# Patient Record
Sex: Female | Born: 1995 | Race: White | Hispanic: No | Marital: Single | Smoking: Never smoker
Health system: Southern US, Community
[De-identification: ages and names within clinical notes are randomized; demographics above are authoritative.]

## PROBLEM LIST (undated history)

## (undated) HISTORY — PX: TONSILLECTOMY: SUR1361

---
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
## 2017-09-04 DIAGNOSIS — R0602 Shortness of breath: Secondary | ICD-10-CM | POA: Insufficient documentation

## 2017-09-04 NOTE — ED Triage Notes (Signed)
Pt reports shortness of breath intermittently throughout the last several weeks, states initially was only at night and recently became more frequent. Today began to occur every ten minutes. Denies medical hx.

## 2017-09-05 ENCOUNTER — Emergency Department (HOSPITAL_COMMUNITY): Payer: Managed Care, Other (non HMO)

## 2017-09-05 ENCOUNTER — Emergency Department (HOSPITAL_COMMUNITY)
Admission: EM | Admit: 2017-09-05 | Discharge: 2017-09-05 | Disposition: A | Discharge status: Home / Self Care | Payer: Managed Care, Other (non HMO) | Attending: Emergency Medicine | Admitting: Emergency Medicine

## 2017-09-05 DIAGNOSIS — R0602 Shortness of breath: Secondary | ICD-10-CM

## 2017-09-05 LAB — BASIC METABOLIC PANEL
ANION GAP: 6 (ref 5–15)
BUN: 8 mg/dL (ref 6–20)
CALCIUM: 9 mg/dL (ref 8.9–10.3)
CO2: 23 mmol/L (ref 22–32)
CREATININE: 0.77 mg/dL (ref 0.44–1.00)
Chloride: 108 mmol/L (ref 101–111)
GFR calc non Af Amer: >60 mL/min (ref >60)
Glucose, Bld: 105 mg/dL — ABNORMAL HIGH (ref 65–99)
Potassium: 3.6 mmol/L (ref 3.5–5.1)
SODIUM: 137 mmol/L (ref 135–145)

## 2017-09-05 LAB — CBC
HCT: 38.9 % (ref 36.0–46.0)
HEMOGLOBIN: 13.1 g/dL (ref 12.0–15.0)
MCH: 28.7 pg (ref 26.0–34.0)
MCHC: 33.7 g/dL (ref 30.0–36.0)
MCV: 85.3 fL (ref 78.0–100.0)
PLATELETS: 302 10*3/uL (ref 150–400)
RBC: 4.56 MIL/uL (ref 3.87–5.11)
RDW: 13.4 % (ref 11.5–15.5)
WBC: 10.6 10*3/uL — AB (ref 4.0–10.5)

## 2017-09-05 LAB — TSH: TSH: 4.273 u[IU]/mL (ref 0.350–4.500)

## 2017-09-05 LAB — I-STAT TROPONIN, ED: Troponin i, poc: 0 ng/mL (ref 0.00–0.08)

## 2017-09-05 NOTE — Discharge Instructions (Signed)
Follow-up with a primary care physician for reevaluation of your shortness of breath. We have drawn your thyroid hormone levels here in the emergency department, but they can follow up with the results. Return to the ED immediately if any concerning signs or symptoms develop such as persistent shortness of breath, chest pain, coughing up blood, or passing out.

## 2017-09-05 NOTE — ED Provider Notes (Signed)
[facility]-EMERGENCY DEPT Provider Note   CSN: [id number] Arrival date & time: 09/04/17  2320     History   Chief Complaint Chief Complaint  Patient presents with  . Shortness of Breath  . Chest Pain    HPI Leslie Fisher is a 21 y.o. female with no significant past medical history who presents today with chief complaint acute onset, progressively worsening intermittent shortness of breath. She states that 2-3 weeks ago, she was in [place] for a family trip when she noted shortness of breath at night. She states that the shortness of breath would awaken her, but states "it was not so bad that I wanted to do anything about it". She states that her symptoms began increasing in frequency and would occur throughout the day, and over the past 2 days she noted shortness of breath every 5-10 minutes or so. She states that shortness of breath may last a few seconds up to a few minutes and is associated with a chest pressure but no pain. Shortness of breath is not brought on by activity or rest and occurs randomly. She denies fevers, chills, cough, hemoptysis, palpitations, leg swelling, abdominal pain, n/v/d. No aggravating or alleviating factors noted. She has not tried anything for her symptoms. She denies excessive caffeine intake, alcohol use, recreational drug use, or prior history of DVT or PE.She does not take oral contraceptive pills or other hormone use.  The history is provided by the patient.    No past medical history on file.  There are no active problems to display for this patient.   Past Surgical History:  Procedure Laterality Date  . TONSILLECTOMY      OB History    No data available       Home Medications    Prior to Admission medications   Medication Sig Start Date End Date Taking? Authorizing Provider  Cholecalciferol (VITAMIN D PO) Take 1 capsule by mouth daily.    [provider]  DM-Doxylamine-Acetaminophen (CONTAC COLD+FLU NIGHT PO) Take 2 capsules by  mouth daily as needed. For cold/flu symptoms    [provider]  Multiple Vitamin (MULITIVITAMIN WITH MINERALS) TABS Take 1 tablet by mouth daily.    [provider]  Probiotic Product (PROBIOTIC PO) Take 1 capsule by mouth daily.    [provider]    Family History No family history on file.  Social History Social History  Substance Use Topics  . Smoking status: Never Smoker  . Smokeless tobacco: Not on file  . Alcohol use No     Allergies   Patient has no known allergies.   Review of Systems Review of Systems  Constitutional: Negative for chills and fever.  HENT: Negative for trouble swallowing.   Respiratory: Positive for chest tightness and shortness of breath. Negative for cough.   Cardiovascular: Negative for chest pain and leg swelling.  Gastrointestinal: Negative for abdominal pain, constipation, diarrhea, nausea and vomiting.  All other systems reviewed and are negative.    Physical Exam Updated Vital Signs BP 113/79   Pulse 81   Temp 98.3 F (36.8 C) (Oral)   Resp 14   Ht  (1.575 m)   Wt 90.7 kg (200 lb)   LMP 08/19/2017   SpO2 97%   BMI 36.58 kg/m   Physical Exam  Constitutional: She appears well-developed and well-nourished. No distress.  Resting comfortably in bed  HENT:  Head: Normocephalic and atraumatic.  Eyes: Conjunctivae are normal. Right eye exhibits no discharge. Left  eye exhibits no discharge.  Neck: No JVD present. No tracheal deviation present.  Cardiovascular: Normal rate, regular rhythm, normal heart sounds and intact distal pulses.  Exam reveals no gallop and no friction rub.   No murmur heard. 2+ radial and DP/PT pulses bl, negative Homan's bl, no lower extremity edema   Pulmonary/Chest: Effort normal and breath sounds normal. No respiratory distress. She has no wheezes. She has no rales. She exhibits no tenderness.  Equal rise and fall of chest, no increased work of breathing, speaking in full  sentences without difficulty  Abdominal: Soft. Bowel sounds are normal. She exhibits no distension. There is no tenderness.  Musculoskeletal: She exhibits no edema.  Neurological: She is alert.  Skin: Skin is warm and dry. No erythema.  Psychiatric: She has a normal mood and affect. Her behavior is normal.  Nursing note and vitals reviewed.    ED Treatments / Results  Labs (all labs ordered are listed, but only abnormal results are displayed) Labs Reviewed  BASIC METABOLIC PANEL - Abnormal; Notable for the following:       Result Value   Glucose, Bld 105 (*)    All other components within normal limits  CBC - Abnormal; Notable for the following:    WBC 10.6 (*)    All other components within normal limits  TSH  I-STAT TROPONIN, ED    EKG  EKG Interpretation  Date/Time:  Tuesday September 04 2017 23:46:20 EDT Ventricular Rate:  98 PR Interval:  148 QRS Duration: 86 QT Interval:  368 QTC Calculation: 469 R Axis:   53 Text Interpretation:  Normal sinus rhythm Normal ECG Confirmed by Geoffery LyonseLo, Douglas ([id number]) on 09/05/2017 7:11:51 AM Also confirmed by Geoffery LyonseLo, Douglas ([id number]), editor Barbette Hairassel, Kerry [phone])  on 09/05/2017 8:06:32 AM       Radiology Dg Chest 2 View  Result Date: 09/05/2017 CLINICAL DATA:  Chest tightness today. EXAM: CHEST  2 VIEW COMPARISON:  None. FINDINGS: Heart size is upper normal. Lungs are clear. No pneumothorax or pleural effusion. No bony abnormality. IMPRESSION: No acute disease. Electronically Signed   By: Drusilla Kannerhomas  Dalessio M.D.   On: 09/05/2017 00:27    Procedures Procedures (including critical care time)  Medications Ordered in ED Medications - No data to display   Initial Impression / Assessment and Plan / ED Course  I have reviewed the triage vital signs and the nursing notes.  Pertinent labs & imaging results that were available during my care of the patient were reviewed by me and considered in my medical decision making (see chart for  details).     Patient with paroxysmal shortness of breath which has been increasing in frequency for 3 weeks. Afebrile, vital signs are stable, SPO2 has been greater than 95% on room air while in the ED. She is nontoxic in appearance and in no apparent distress. Breath sounds clear to auscultation bilaterally with no evidence of wheezing. EKG shows normal sinus rhythm without evidence of arrhythmia or ST segment abnormality. She is PERC negative and I doubt PE as the source of her symptoms. Chest x-ray shows no acute cardiopulmonary abnormality with no evidence of pneumonia, TB. No evidence of pericarditis. She has no chest pain. Possible thyroid abnormality contributing to her symptoms, TCH was drawn but she does not need to stay here for the results. She is stable for discharge home with primary care follow-up. Discussed indications for return to the ED. Pt verbalized understanding of and agreement with plan and is safe for  discharge home at this time.  Final Clinical Impressions(s) / ED Diagnoses   Final diagnoses:  Shortness of breath    New Prescriptions Discharge Medication List as of 09/05/2017  7:11 AM       Jeanie Sewer, PA-C 09/05/17 1610    Geoffery Lyons, MD 09/06/17 0236

## 2017-11-13 LAB — OB RESULTS CONSOLE ANTIBODY SCREEN: ANTIBODY SCREEN: NEGATIVE

## 2017-11-13 LAB — OB RESULTS CONSOLE RUBELLA ANTIBODY, IGM: Rubella: IMMUNE

## 2017-11-13 LAB — OB RESULTS CONSOLE GC/CHLAMYDIA
Chlamydia: NEGATIVE
GC PROBE AMP, GENITAL: NEGATIVE

## 2017-11-13 LAB — OB RESULTS CONSOLE RPR: RPR: NONREACTIVE

## 2017-11-13 LAB — OB RESULTS CONSOLE ABO/RH: RH TYPE: POSITIVE

## 2017-11-13 LAB — OB RESULTS CONSOLE HIV ANTIBODY (ROUTINE TESTING): HIV: NONREACTIVE

## 2017-11-13 LAB — OB RESULTS CONSOLE HEPATITIS B SURFACE ANTIGEN: HEP B S AG: NEGATIVE

## 2018-02-11 IMAGING — CR DG CHEST 2V
2 series · 2 of 2 positions shown · non-contrast
Comparison: None.

CLINICAL DATA: Chest tightness today.

EXAM:
CHEST  2 VIEW

[chest pa]
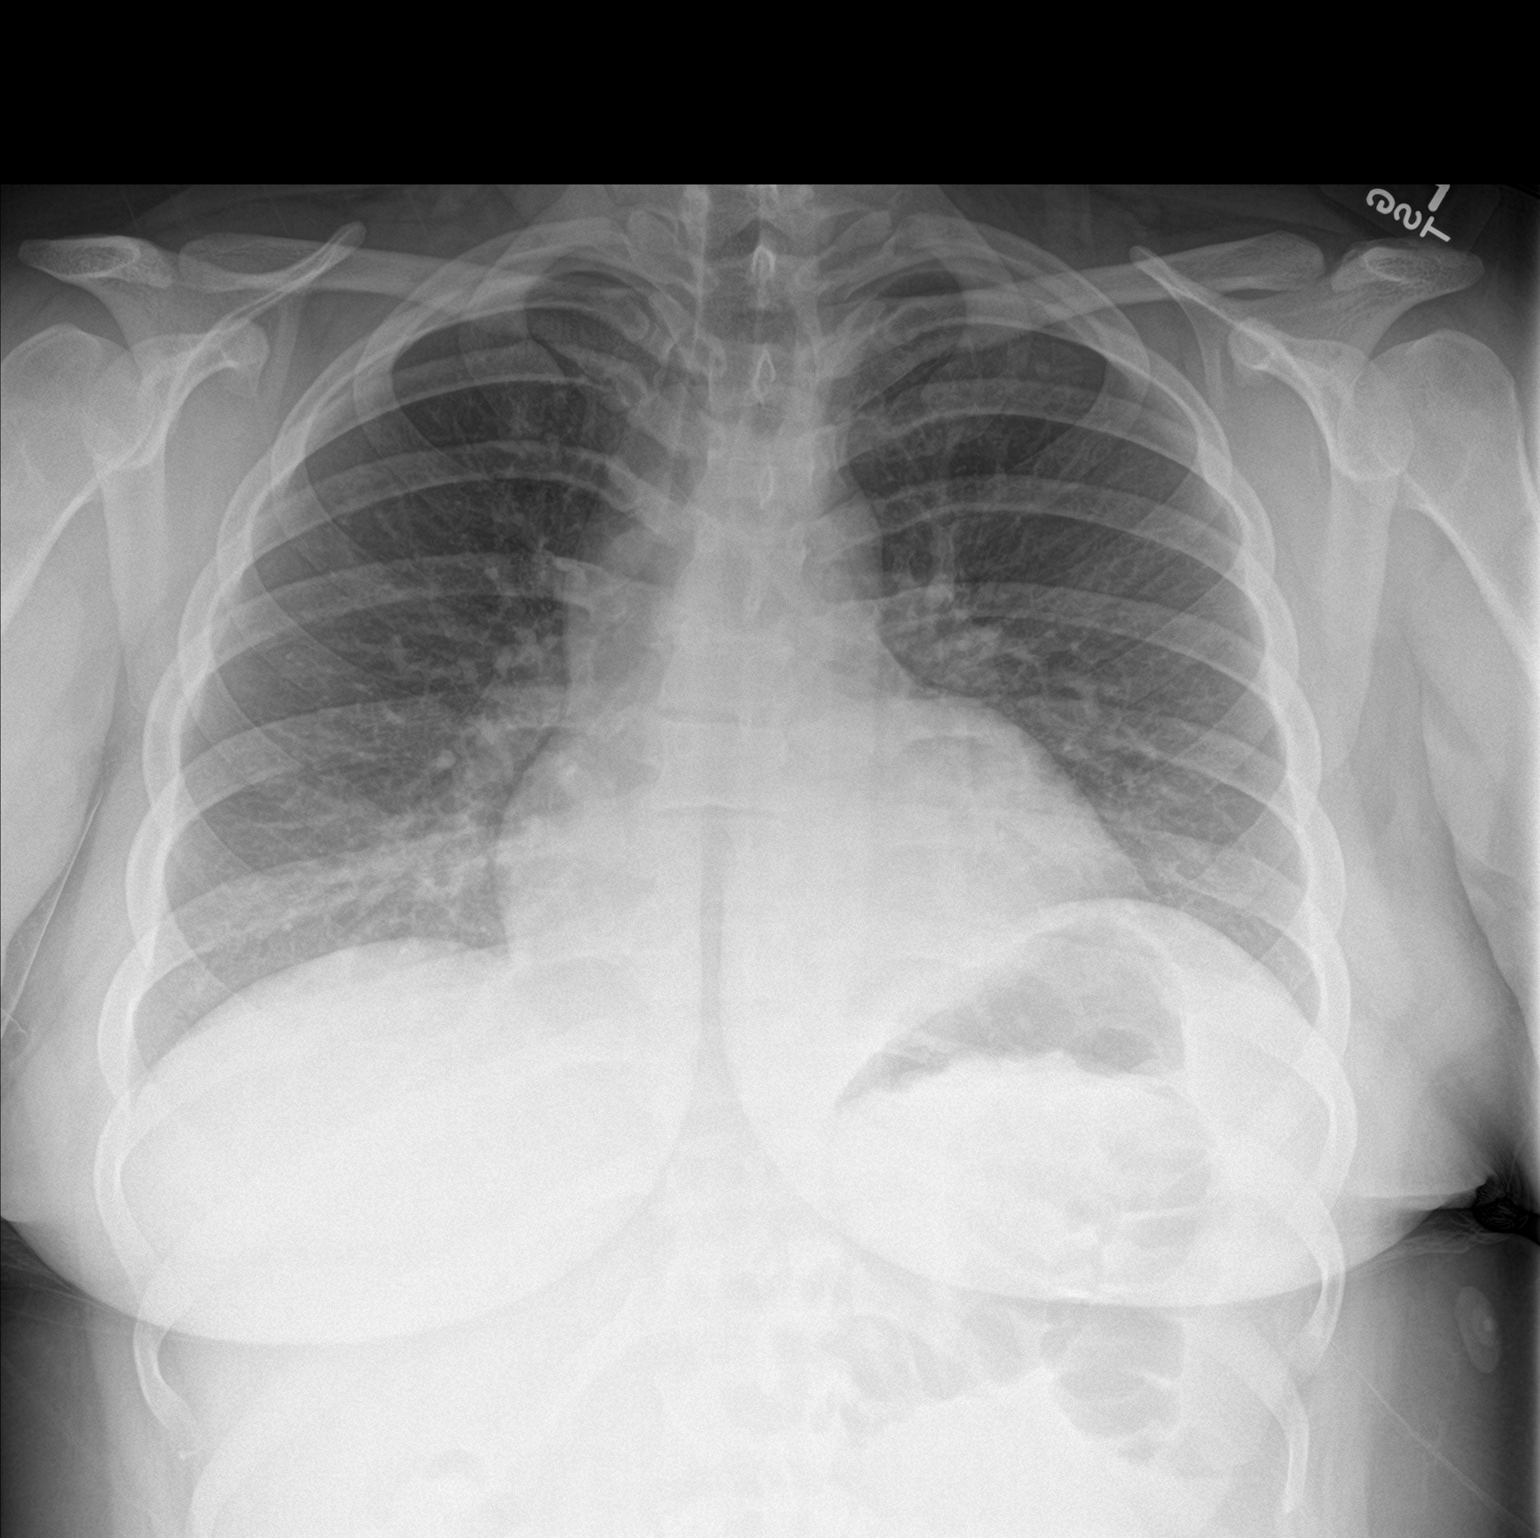

[chest lat]
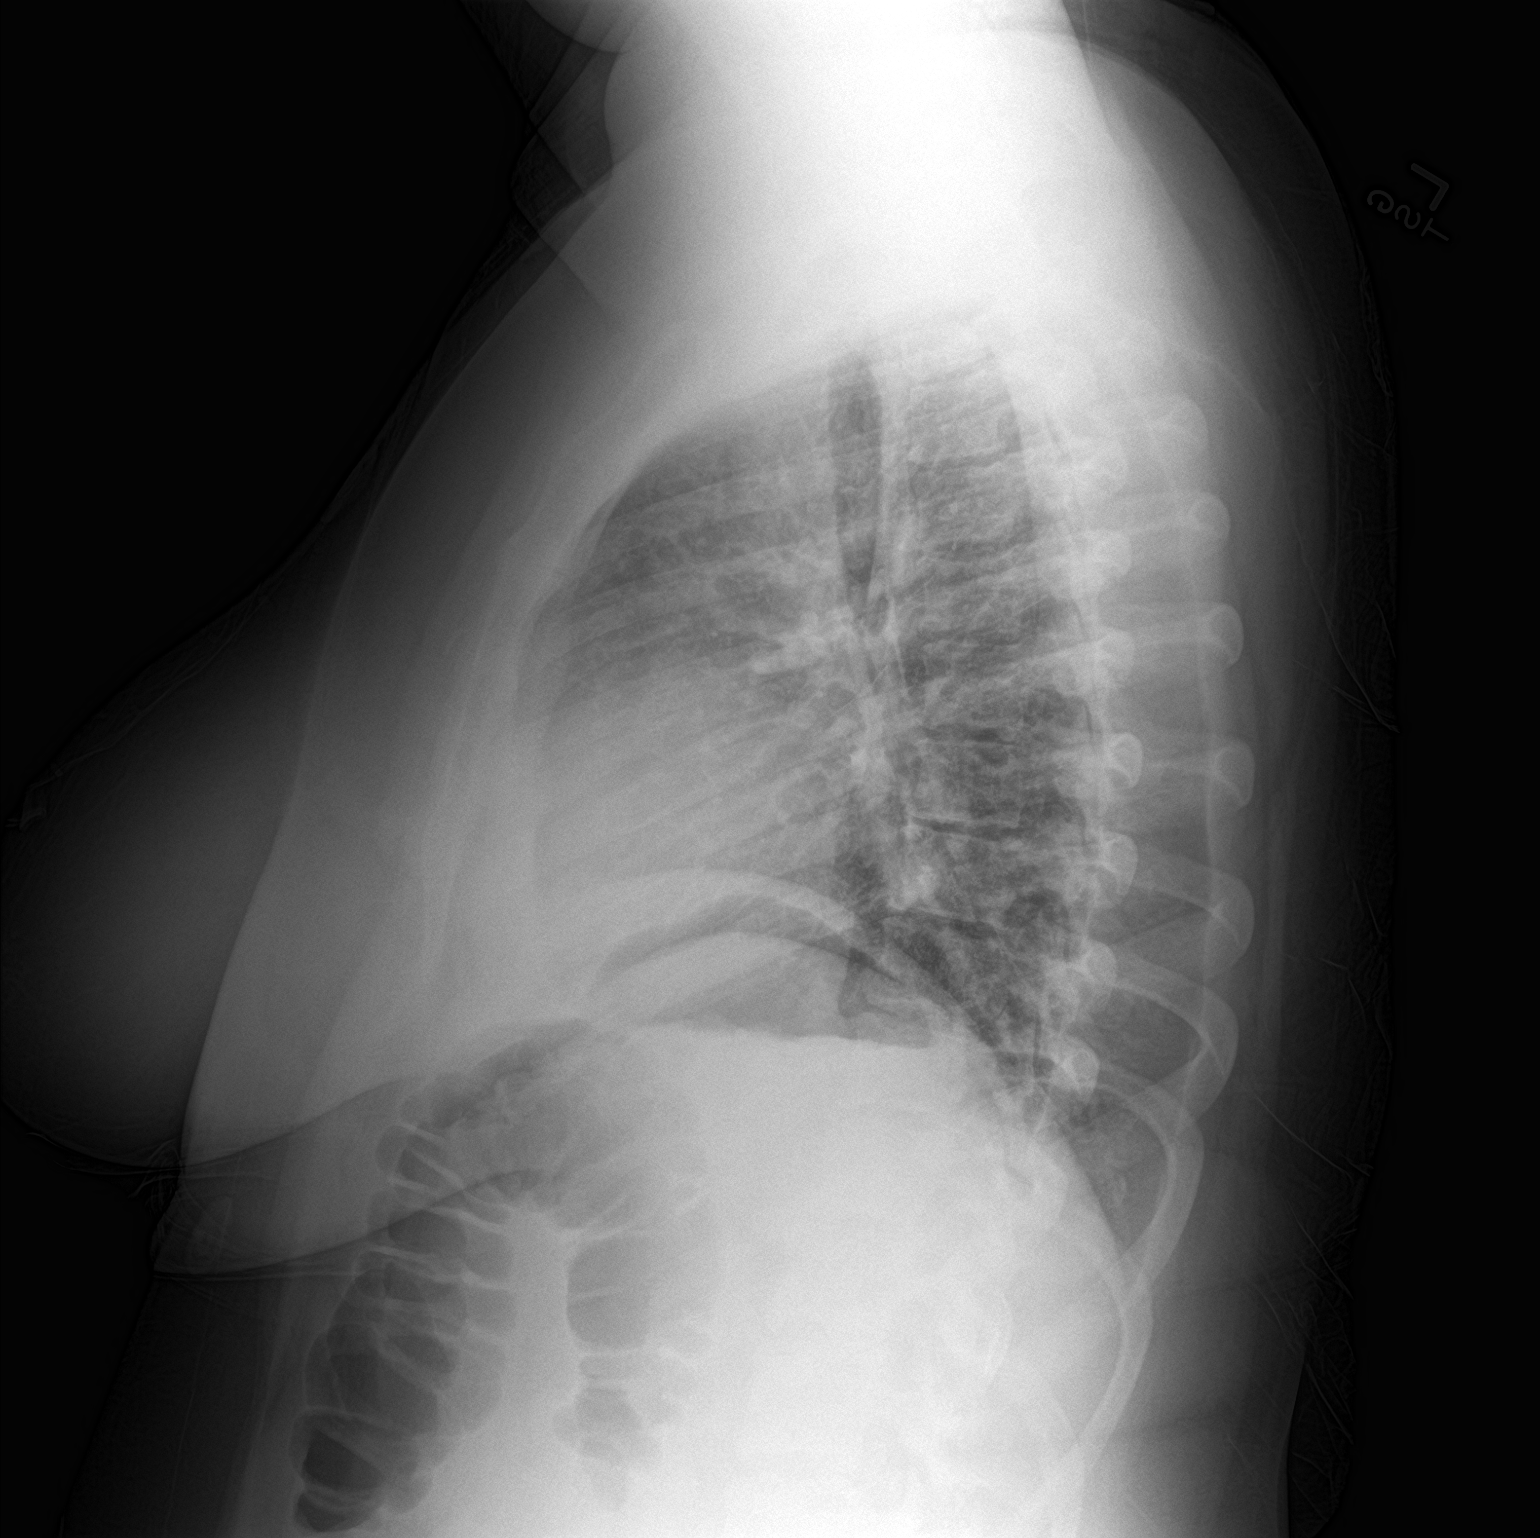

[2 of 2 positions shown; findings below may reference images not displayed]

FINDINGS: Heart size is upper normal. Lungs are clear. No pneumothorax or
pleural effusion. No bony abnormality.
IMPRESSION: No acute disease.

## 2018-05-27 LAB — OB RESULTS CONSOLE GBS
GBS: NEGATIVE
GBS: NEGATIVE
STREP GROUP B AG: NEGATIVE

## 2018-06-13 ENCOUNTER — Telehealth (HOSPITAL_COMMUNITY): Payer: Self-pay | Admitting: *Deleted

## 2018-06-13 ENCOUNTER — Encounter (HOSPITAL_COMMUNITY): Payer: Self-pay | Admitting: *Deleted

## 2018-06-13 NOTE — Telephone Encounter (Signed)
Preadmission screen  

## 2018-06-19 ENCOUNTER — Inpatient Hospital Stay (HOSPITAL_COMMUNITY): Payer: 59 | Admitting: Anesthesiology

## 2018-06-19 ENCOUNTER — Other Ambulatory Visit: Payer: Self-pay

## 2018-06-19 ENCOUNTER — Inpatient Hospital Stay (HOSPITAL_COMMUNITY)
Admission: AD | Admit: 2018-06-19 | Discharge: 2018-06-22 | DRG: 788 | Disposition: A | Discharge status: Home / Self Care | Payer: 59 | Attending: Obstetrics and Gynecology | Admitting: Obstetrics and Gynecology

## 2018-06-19 ENCOUNTER — Encounter (HOSPITAL_COMMUNITY): Payer: Self-pay | Admitting: *Deleted

## 2018-06-19 ENCOUNTER — Encounter (HOSPITAL_COMMUNITY)
Admission: AD | Disposition: A | Discharge status: Home / Self Care | Payer: Self-pay | Source: Home / Self Care | Attending: Obstetrics and Gynecology

## 2018-06-19 DIAGNOSIS — O99214 Obesity complicating childbirth: Secondary | ICD-10-CM | POA: Diagnosis present

## 2018-06-19 DIAGNOSIS — Z3A39 39 weeks gestation of pregnancy: Secondary | ICD-10-CM | POA: Diagnosis not present

## 2018-06-19 DIAGNOSIS — Z3483 Encounter for supervision of other normal pregnancy, third trimester: Secondary | ICD-10-CM | POA: Diagnosis present

## 2018-06-19 LAB — CBC
HEMATOCRIT: 36.3 % (ref 36.0–46.0)
Hemoglobin: 12.1 g/dL (ref 12.0–15.0)
MCH: 28.3 pg (ref 26.0–34.0)
MCHC: 33.3 g/dL (ref 30.0–36.0)
MCV: 85 fL (ref 78.0–100.0)
Platelets: 207 10*3/uL (ref 150–400)
RBC: 4.27 MIL/uL (ref 3.87–5.11)
RDW: 15.5 % (ref 11.5–15.5)
WBC: 11.1 10*3/uL — ABNORMAL HIGH (ref 4.0–10.5)

## 2018-06-19 LAB — RPR: RPR Ser Ql: NONREACTIVE

## 2018-06-19 LAB — TYPE AND SCREEN
ABO/RH(D): O POS
ANTIBODY SCREEN: NEGATIVE

## 2018-06-19 LAB — ABO/RH: ABO/RH(D): O POS

## 2018-06-19 SURGERY — Surgical Case
Anesthesia: Epidural | Site: Abdomen | Wound class: Clean Contaminated

## 2018-06-19 MED ORDER — DIPHENHYDRAMINE HCL 50 MG/ML IJ SOLN: 12.5000 mg | INTRAMUSCULAR | Status: DC | PRN

## 2018-06-19 MED ORDER — LIDOCAINE HCL (PF) 1 % IJ SOLN: 30.0000 mL | INTRAMUSCULAR | Status: DC | PRN

## 2018-06-19 MED ORDER — OXYTOCIN 10 UNIT/ML IJ SOLN
INTRAMUSCULAR | Status: AC
  Filled 2018-06-19: qty 1

## 2018-06-19 MED ORDER — LIDOCAINE HCL (PF) 1 % IJ SOLN
INTRAMUSCULAR | Status: DC | PRN
  Administered 2018-06-19 (×2): 6 mL via EPIDURAL

## 2018-06-19 MED ORDER — DEXAMETHASONE SODIUM PHOSPHATE 4 MG/ML IJ SOLN
INTRAMUSCULAR | Status: DC | PRN
  Administered 2018-06-19: 4 mg via INTRAVENOUS

## 2018-06-19 MED ORDER — LACTATED RINGERS IV SOLN: 500.0000 mL | Freq: Once | INTRAVENOUS | Status: DC

## 2018-06-19 MED ORDER — PHENYLEPHRINE 40 MCG/ML (10ML) SYRINGE FOR IV PUSH (FOR BLOOD PRESSURE SUPPORT)
PREFILLED_SYRINGE | INTRAVENOUS | Status: AC
  Filled 2018-06-19: qty 10

## 2018-06-19 MED ORDER — EPHEDRINE 5 MG/ML INJ: 10.0000 mg | INTRAVENOUS | Status: DC | PRN

## 2018-06-19 MED ORDER — FENTANYL 2.5 MCG/ML BUPIVACAINE 1/10 % EPIDURAL INFUSION (WH - ANES)
14.0000 mL/h | INTRAMUSCULAR | Status: DC | PRN
  Administered 2018-06-19 (×2): 14 mL/h via EPIDURAL
  Filled 2018-06-19 (×2): qty 100

## 2018-06-19 MED ORDER — SCOPOLAMINE 1 MG/3DAYS TD PT72
MEDICATED_PATCH | TRANSDERMAL | Status: AC
  Filled 2018-06-19: qty 1

## 2018-06-19 MED ORDER — OXYCODONE-ACETAMINOPHEN 5-325 MG PO TABS: 1.0000 | ORAL_TABLET | ORAL | Status: DC | PRN

## 2018-06-19 MED ORDER — FLEET ENEMA 7-19 GM/118ML RE ENEM: 1.0000 | ENEMA | RECTAL | Status: DC | PRN

## 2018-06-19 MED ORDER — DEXAMETHASONE SODIUM PHOSPHATE 4 MG/ML IJ SOLN
INTRAMUSCULAR | Status: AC
Start: 2018-06-19 — End: ?
  Filled 2018-06-19: qty 1

## 2018-06-19 MED ORDER — ONDANSETRON HCL 4 MG/2ML IJ SOLN
INTRAMUSCULAR | Status: DC | PRN
  Administered 2018-06-19: 4 mg via INTRAVENOUS

## 2018-06-19 MED ORDER — MEPERIDINE HCL 25 MG/ML IJ SOLN
INTRAMUSCULAR | Status: DC | PRN
  Administered 2018-06-19 (×2): 12.5 mg via INTRAVENOUS

## 2018-06-19 MED ORDER — SOD CITRATE-CITRIC ACID 500-334 MG/5ML PO SOLN
30.0000 mL | ORAL | Status: DC | PRN
  Administered 2018-06-19: 30 mL via ORAL
  Filled 2018-06-19: qty 15

## 2018-06-19 MED ORDER — ONDANSETRON HCL 4 MG/2ML IJ SOLN
4.0000 mg | Freq: Four times a day (QID) | INTRAMUSCULAR | Status: DC | PRN
  Administered 2018-06-19 (×2): 4 mg via INTRAVENOUS
  Filled 2018-06-19 (×2): qty 2

## 2018-06-19 MED ORDER — LACTATED RINGERS IV SOLN: 500.0000 mL | INTRAVENOUS | Status: DC | PRN

## 2018-06-19 MED ORDER — MEPERIDINE HCL 25 MG/ML IJ SOLN
INTRAMUSCULAR | Status: AC
  Filled 2018-06-19: qty 1

## 2018-06-19 MED ORDER — FENTANYL CITRATE (PF) 100 MCG/2ML IJ SOLN
100.0000 ug | INTRAMUSCULAR | Status: DC | PRN
  Administered 2018-06-19 (×3): 100 ug via INTRAVENOUS
  Filled 2018-06-19 (×3): qty 2

## 2018-06-19 MED ORDER — MORPHINE SULFATE (PF) 0.5 MG/ML IJ SOLN
INTRAMUSCULAR | Status: AC
  Filled 2018-06-19: qty 10

## 2018-06-19 MED ORDER — PHENYLEPHRINE 40 MCG/ML (10ML) SYRINGE FOR IV PUSH (FOR BLOOD PRESSURE SUPPORT): 80.0000 ug | PREFILLED_SYRINGE | INTRAVENOUS | Status: DC | PRN

## 2018-06-19 MED ORDER — OXYTOCIN 10 UNIT/ML IJ SOLN
INTRAMUSCULAR | Status: DC | PRN
  Administered 2018-06-19: 40 [IU] via INTRAVENOUS

## 2018-06-19 MED ORDER — PHENYLEPHRINE HCL 10 MG/ML IJ SOLN
INTRAMUSCULAR | Status: DC | PRN
  Administered 2018-06-19 (×5): 80 ug via INTRAVENOUS

## 2018-06-19 MED ORDER — SODIUM BICARBONATE 8.4 % IV SOLN
INTRAVENOUS | Status: DC | PRN
  Administered 2018-06-19 (×2): 5 mL via EPIDURAL

## 2018-06-19 MED ORDER — OXYTOCIN 40 UNITS IN LACTATED RINGERS INFUSION - SIMPLE MED
1.0000 m[IU]/min | INTRAVENOUS | Status: DC
  Administered 2018-06-19: 2 m[IU]/min via INTRAVENOUS
  Filled 2018-06-19: qty 1000

## 2018-06-19 MED ORDER — LACTATED RINGERS IV SOLN
INTRAVENOUS | Status: DC
  Administered 2018-06-19 (×3): via INTRAVENOUS

## 2018-06-19 MED ORDER — LACTATED RINGERS IV SOLN
INTRAVENOUS | Status: DC | PRN
  Administered 2018-06-19: via INTRAVENOUS

## 2018-06-19 MED ORDER — OXYCODONE-ACETAMINOPHEN 5-325 MG PO TABS: 2.0000 | ORAL_TABLET | ORAL | Status: DC | PRN

## 2018-06-19 MED ORDER — ONDANSETRON HCL 4 MG/2ML IJ SOLN
INTRAMUSCULAR | Status: AC
  Filled 2018-06-19: qty 2

## 2018-06-19 MED ORDER — OXYTOCIN BOLUS FROM INFUSION: 500.0000 mL | Freq: Once | INTRAVENOUS | Status: DC

## 2018-06-19 MED ORDER — PHENYLEPHRINE 40 MCG/ML (10ML) SYRINGE FOR IV PUSH (FOR BLOOD PRESSURE SUPPORT)
80.0000 ug | PREFILLED_SYRINGE | INTRAVENOUS | Status: DC | PRN
  Filled 2018-06-19: qty 10

## 2018-06-19 MED ORDER — OXYTOCIN 40 UNITS IN LACTATED RINGERS INFUSION - SIMPLE MED: 2.5000 [IU]/h | INTRAVENOUS | Status: DC

## 2018-06-19 MED ORDER — TERBUTALINE SULFATE 1 MG/ML IJ SOLN: 0.2500 mg | Freq: Once | INTRAMUSCULAR | Status: DC | PRN

## 2018-06-19 MED ORDER — SODIUM CHLORIDE 0.9 % IV SOLN
2.0000 g | Freq: Two times a day (BID) | INTRAVENOUS | Status: DC
  Administered 2018-06-19: 2 g via INTRAVENOUS
  Filled 2018-06-19 (×2): qty 2

## 2018-06-19 MED ORDER — ACETAMINOPHEN 325 MG PO TABS: 650.0000 mg | ORAL_TABLET | ORAL | Status: DC | PRN

## 2018-06-19 SURGICAL SUPPLY — 29 items
CHLORAPREP W/TINT 26ML (MISCELLANEOUS) ×2 IMPLANT
CLAMP CORD UMBIL (MISCELLANEOUS) IMPLANT
CLOTH BEACON ORANGE TIMEOUT ST (SAFETY) ×2 IMPLANT
DRSG OPSITE POSTOP 4X10 (GAUZE/BANDAGES/DRESSINGS) ×2 IMPLANT
ELECT REM PT RETURN 9FT ADLT (ELECTROSURGICAL) ×2
ELECTRODE REM PT RTRN 9FT ADLT (ELECTROSURGICAL) ×1 IMPLANT
EXTRACTOR VACUUM M CUP 4 TUBE (SUCTIONS) IMPLANT
GLOVE BIOGEL PI IND STRL 7.0 (GLOVE) ×1 IMPLANT
GLOVE BIOGEL PI INDICATOR 7.0 (GLOVE) ×1
GLOVE SURG ORTHO 8.0 STRL STRW (GLOVE) ×2 IMPLANT
GOWN STRL REUS W/TWL LRG LVL3 (GOWN DISPOSABLE) ×4 IMPLANT
HOVERMATT HALF SINGLE USE (PATIENT TRANSFER) ×2 IMPLANT
KIT ABG SYR 3ML LUER SLIP (SYRINGE) ×2 IMPLANT
NEEDLE HYPO 25X5/8 SAFETYGLIDE (NEEDLE) ×2 IMPLANT
NS IRRIG 1000ML POUR BTL (IV SOLUTION) ×2 IMPLANT
PACK C SECTION WH (CUSTOM PROCEDURE TRAY) ×2 IMPLANT
PAD OB MATERNITY 4.3X12.25 (PERSONAL CARE ITEMS) ×2 IMPLANT
PENCIL SMOKE EVAC W/HOLSTER (ELECTROSURGICAL) ×2 IMPLANT
RETRACTOR TRAXI PANNICULUS (MISCELLANEOUS) ×1 IMPLANT
SUT MNCRL 0 VIOLET CTX 36 (SUTURE) ×3 IMPLANT
SUT MON AB 4-0 PS1 27 (SUTURE) ×2 IMPLANT
SUT MONOCRYL 0 CTX 36 (SUTURE) ×3
SUT PDS AB 1 CT  36 (SUTURE)
SUT PDS AB 1 CT 36 (SUTURE) IMPLANT
SUT VIC AB 1 CTX 36 (SUTURE)
SUT VIC AB 1 CTX36XBRD ANBCTRL (SUTURE) IMPLANT
TOWEL OR 17X24 6PK STRL BLUE (TOWEL DISPOSABLE) ×2 IMPLANT
TRAXI PANNICULUS RETRACTOR (MISCELLANEOUS) ×1
TRAY FOLEY W/BAG SLVR 14FR LF (SET/KITS/TRAYS/PACK) ×2 IMPLANT

## 2018-06-19 NOTE — Anesthesia Procedure Notes (Signed)
Epidural Patient location during procedure: OB Start time: 06/19/2018 12:23 PM End time: 06/19/2018 12:27 PM  Staffing Anesthesiologist: Leilani AbleHatchett, Dontrelle Mazon, MD Performed: anesthesiologist   Preanesthetic Checklist Completed: patient identified, site marked, surgical consent, pre-op evaluation, timeout performed, IV checked, risks and benefits discussed and monitors and equipment checked  Epidural Patient position: sitting Prep: site prepped and draped and DuraPrep Patient monitoring: continuous pulse ox and blood pressure Approach: midline Location: L3-L4 Injection technique: LOR air  Needle:  Needle type: Tuohy  Needle gauge: 17 G Needle length: 9 cm and 9 Needle insertion depth: 9 cm Catheter type: closed end flexible Catheter size: 19 Gauge Catheter at skin depth: 15 cm Test dose: negative and Other  Assessment Sensory level: T9 Events: blood not aspirated, injection not painful, no injection resistance, negative IV test and no paresthesia  Additional Notes Reason for block:procedure for pain

## 2018-06-19 NOTE — Progress Notes (Signed)
Patient ID: Leslie GroomRachel M Fisher, female   DOB: 20-Jul-1996, 22 y.o.   MRN: [id number] Pt had epidural placed with great results I checked the patient at 1700 and placed IUPC at that time.  Cx was 4/c/-3 She has had adequate labor pattern now for 5 1/2 hours with no cx change and remains 4/c/-3  Due to arrest of dilation, plan primary c/s Risks and benefits of C/S were discussed.  All questions were answered and informed consent was obtained.  Plan to proceed with low segment transverse Cesarean Section.

## 2018-06-19 NOTE — Consult Note (Signed)
Neonatology Note:   Attendance at C-section:    I was asked by Dr. Rana SnareLowe to attend this primary C/S at term. The mother is a G1, GBS neg with good prenatal care. ROM 19 hours before delivery, fluid clear. Infant vigorous with good spontaneous cry and tone. Needed only minimal bulb suctioning. +60 sec DCC.  Ap 8/9. Lungs clear to ausc in DR. To CN to care of Pediatrician.  Dineen Kidavid C. Leary RocaEhrmann, MD

## 2018-06-19 NOTE — Anesthesia Preprocedure Evaluation (Signed)
Anesthesia Evaluation  Patient identified by MRN, date of birth, ID band Patient awake    Reviewed: Allergy & Precautions, H&P , NPO status , Patient's Chart, lab work & pertinent test results  Airway Mallampati: III  TM Distance: >3 FB Neck ROM: full    Dental no notable dental hx. (+) Teeth Intact   Pulmonary neg pulmonary ROS,    Pulmonary exam normal breath sounds clear to auscultation       Cardiovascular negative cardio ROS Normal cardiovascular exam Rhythm:regular Rate:Normal     Neuro/Psych negative neurological ROS  negative psych ROS   GI/Hepatic negative GI ROS, Neg liver ROS,   Endo/Other  Morbid obesity  Renal/GU negative Renal ROS  negative genitourinary   Musculoskeletal negative musculoskeletal ROS (+)   Abdominal (+) + obese,   Peds  Hematology negative hematology ROS (+)   Anesthesia Other Findings   Reproductive/Obstetrics (+) Pregnancy                             Anesthesia Physical Anesthesia Plan  ASA: III  Anesthesia Plan: Epidural   Post-op Pain Management:    Induction:   PONV Risk Score and Plan:   Airway Management Planned:   Additional Equipment:   Intra-op Plan:   Post-operative Plan:   Informed Consent: I have reviewed the patients History and Physical, chart, labs and discussed the procedure including the risks, benefits and alternatives for the proposed anesthesia with the patient or authorized representative who has indicated his/her understanding and acceptance.     Plan Discussed with:   Anesthesia Plan Comments:         Anesthesia Quick Evaluation  

## 2018-06-19 NOTE — MAU Note (Addendum)
PT SAYS SROM AT 0403-  CLEAR FLUID. VE  IN OFFICE - CLOSED  DENIES HSV AND MRSA.   GBS-  NEG.  FEELING SOME  UC'S.

## 2018-06-19 NOTE — H&P (Signed)
Leslie Fisher is a 22 y.o. female presenting for SROM clear fluid at 0403. Pregnancy uncomplicated.  GBS -. OB History    Gravida  1   Para      Term      Preterm      AB      Living        SAB      TAB      Ectopic      Multiple      Live Births             History reviewed. No pertinent past medical history. Past Surgical History:  Procedure Laterality Date  . TONSILLECTOMY     Family History: family history includes Breast cancer in her maternal grandmother; Hypertension in her father; Rheum arthritis in her maternal grandmother. Social History:  reports that she has never smoked. She has never used smokeless tobacco. She reports that she does not drink alcohol or use drugs.     Maternal Diabetes: No Genetic Screening: Normal Maternal Ultrasounds/Referrals: Normal Fetal Ultrasounds or other Referrals:  None Maternal Substance Abuse:  No Significant Maternal Medications:  None Significant Maternal Lab Results:  None Other Comments:  None  ROS History Dilation: 1 Effacement (%): 60 Station: -1 Exam by:: DCALLAWAY, RN   Blood pressure 139/90, pulse 96, temperature 98.1 F (36.7 C), temperature source Oral, resp. rate (!) 24, height 5\' 2"  (1.575 m), weight 255 lb (115.7 kg), last menstrual period 09/14/2017. Exam Physical Exam  Prenatal labs: ABO, Rh: O/Positive/-- (11/20 0000) Antibody: Negative (11/20 0000) Rubella: Immune (11/20 0000) RPR: Nonreactive (11/20 0000)  HBsAg: Negative (11/20 0000)  HIV: Non-reactive (11/20 0000)  GBS: Negative, Negative, Negative (06/03 0000)   Assessment/Plan: IUP at term with SROM and early labor sxs Anticipate SVD  Will augment with pitocin prn   Leslie Fisher C 06/19/2018, 7:57 AM

## 2018-06-19 NOTE — Anesthesia Pain Management Evaluation Note (Signed)
  CRNA Pain Management Visit Note  Patient: Leslie Fisher, 22 y.o., female  "Hello I am a member of the anesthesia team at [facility]. We have an anesthesia team available at all times to provide care throughout the hospital, including epidural management and anesthesia for C-section. I don't know your plan for the delivery whether it a natural birth, water birth, IV sedation, nitrous supplementation, doula or epidural, but we want to meet your pain goals."   1.Was your pain managed to your expectations on prior hospitalizations?   No prior hospitalizations  2.What is your expectation for pain management during this hospitalization?     Labor support without medications, Epidural and IV pain meds  3.How can we help you reach that goal? support  Record the patient's initial score and the patient's pain goal.   Pain: 5  Pain Goal: 9 [facility] wants you to be able to say your pain was always managed very well.  Trellis PaganiniBREWER,Emmajean Ratledge N 06/19/2018

## 2018-06-20 ENCOUNTER — Encounter (HOSPITAL_COMMUNITY): Payer: Self-pay | Admitting: Obstetrics and Gynecology

## 2018-06-20 LAB — CBC
HCT: 29.6 % — ABNORMAL LOW (ref 36.0–46.0)
HEMOGLOBIN: 9.9 g/dL — AB (ref 12.0–15.0)
MCH: 28.3 pg (ref 26.0–34.0)
MCHC: 33.4 g/dL (ref 30.0–36.0)
MCV: 84.6 fL (ref 78.0–100.0)
PLATELETS: 206 10*3/uL (ref 150–400)
RBC: 3.5 MIL/uL — ABNORMAL LOW (ref 3.87–5.11)
RDW: 15.6 % — ABNORMAL HIGH (ref 11.5–15.5)
WBC: 18.7 10*3/uL — ABNORMAL HIGH (ref 4.0–10.5)

## 2018-06-20 MED ORDER — OXYCODONE HCL 5 MG PO TABS: 10.0000 mg | ORAL_TABLET | ORAL | Status: DC | PRN

## 2018-06-20 MED ORDER — PRENATAL MULTIVITAMIN CH
1.0000 | ORAL_TABLET | Freq: Every day | ORAL | Status: DC
  Administered 2018-06-20 – 2018-06-21 (×2): 1 via ORAL
  Filled 2018-06-20 (×3): qty 1

## 2018-06-20 MED ORDER — MEASLES, MUMPS & RUBELLA VAC ~~LOC~~ INJ
0.5000 mL | INJECTION | Freq: Once | SUBCUTANEOUS | Status: DC
  Filled 2018-06-20: qty 0.5

## 2018-06-20 MED ORDER — IBUPROFEN 600 MG PO TABS
600.0000 mg | ORAL_TABLET | Freq: Four times a day (QID) | ORAL | Status: DC
  Administered 2018-06-20 – 2018-06-22 (×9): 600 mg via ORAL
  Filled 2018-06-20 (×9): qty 1

## 2018-06-20 MED ORDER — MENTHOL 3 MG MT LOZG: 1.0000 | LOZENGE | OROMUCOSAL | Status: DC | PRN

## 2018-06-20 MED ORDER — OXYTOCIN 40 UNITS IN LACTATED RINGERS INFUSION - SIMPLE MED: 2.5000 [IU]/h | INTRAVENOUS | Status: AC

## 2018-06-20 MED ORDER — MORPHINE SULFATE (PF) 0.5 MG/ML IJ SOLN
INTRAMUSCULAR | Status: DC | PRN
  Administered 2018-06-20: 3.5 mg via EPIDURAL

## 2018-06-20 MED ORDER — SIMETHICONE 80 MG PO CHEW: 80.0000 mg | CHEWABLE_TABLET | ORAL | Status: DC | PRN

## 2018-06-20 MED ORDER — OXYCODONE HCL 5 MG PO TABS: 5.0000 mg | ORAL_TABLET | ORAL | Status: DC | PRN

## 2018-06-20 MED ORDER — DIBUCAINE 1 % RE OINT: 1.0000 "application " | TOPICAL_OINTMENT | RECTAL | Status: DC | PRN

## 2018-06-20 MED ORDER — SIMETHICONE 80 MG PO CHEW
80.0000 mg | CHEWABLE_TABLET | ORAL | Status: DC
  Administered 2018-06-20 – 2018-06-21 (×2): 80 mg via ORAL
  Filled 2018-06-20 (×2): qty 1

## 2018-06-20 MED ORDER — TETANUS-DIPHTH-ACELL PERTUSSIS 5-2.5-18.5 LF-MCG/0.5 IM SUSP: 0.5000 mL | Freq: Once | INTRAMUSCULAR | Status: DC

## 2018-06-20 MED ORDER — SENNOSIDES-DOCUSATE SODIUM 8.6-50 MG PO TABS
2.0000 | ORAL_TABLET | ORAL | Status: DC
  Administered 2018-06-20 – 2018-06-21 (×2): 2 via ORAL
  Filled 2018-06-20 (×2): qty 2

## 2018-06-20 MED ORDER — DIPHENHYDRAMINE HCL 25 MG PO CAPS: 25.0000 mg | ORAL_CAPSULE | Freq: Four times a day (QID) | ORAL | Status: DC | PRN

## 2018-06-20 MED ORDER — LACTATED RINGERS IV SOLN
INTRAVENOUS | Status: DC
  Administered 2018-06-20 (×2): via INTRAVENOUS

## 2018-06-20 MED ORDER — ZOLPIDEM TARTRATE 5 MG PO TABS: 5.0000 mg | ORAL_TABLET | Freq: Every evening | ORAL | Status: DC | PRN

## 2018-06-20 MED ORDER — COCONUT OIL OIL: 1.0000 "application " | TOPICAL_OIL | Status: DC | PRN

## 2018-06-20 MED ORDER — ACETAMINOPHEN 325 MG PO TABS: 650.0000 mg | ORAL_TABLET | ORAL | Status: DC | PRN

## 2018-06-20 MED ORDER — WITCH HAZEL-GLYCERIN EX PADS: 1.0000 "application " | MEDICATED_PAD | CUTANEOUS | Status: DC | PRN

## 2018-06-20 MED ORDER — SIMETHICONE 80 MG PO CHEW
80.0000 mg | CHEWABLE_TABLET | Freq: Three times a day (TID) | ORAL | Status: DC
  Administered 2018-06-20 – 2018-06-22 (×5): 80 mg via ORAL
  Filled 2018-06-20 (×6): qty 1

## 2018-06-20 NOTE — Progress Notes (Signed)
Subjective: Postpartum Day 1: Cesarean Delivery Patient reports incisional pain and tolerating PO.    Objective: Vital signs in last 24 hours: Temp:  [97.8 F (36.6 C)-99 F (37.2 C)] 98.3 F (36.8 C) (06/27 0537) Pulse Rate:  [65-145] 89 (06/27 0537) Resp:  [11-27] 19 (06/27 0537) BP: (83-155)/(47-110) 109/74 (06/27 0537) SpO2:  [96 %-100 %] 99 % (06/27 0537)  Physical Exam:  General: alert and cooperative Lochia: appropriate Uterine Fundus: firm Incision: healing well, no significant drainage DVT Evaluation: No evidence of DVT seen on physical exam.  Recent Labs    06/19/18 0700 06/20/18 0539  HGB 12.1 9.9*  HCT 36.3 29.6*    Assessment/Plan: Status post Cesarean section. Doing well postoperatively.  Continue current care.  Leslie Fisher 06/20/2018, 9:16 AM

## 2018-06-20 NOTE — Op Note (Signed)
Cesarean Section Procedure Note  Pre-operative Diagnosis: IUP at 39 weeks, Arrest of dilation   Post-operative Diagnosis: same  Surgeon: Turner DanielsLOWE,Kenniya Westrich C   Assistants: none  Anesthesia: epidural  Procedure:  Low Segment Transverse cesarean section  Procedure Details  The patient was seen in the Holding Room. The risks, benefits, complications, treatment options, and expected outcomes were discussed with the patient.  The patient concurred with the proposed plan, giving informed consent.  The site of surgery properly noted/marked.. A Time Out was held and the above information confirmed.  After induction of anesthesia, the patient was draped and prepped in the usual sterile manner. A Pfannenstiel incision was made and carried down through the subcutaneous tissue to the fascia. Fascial incision was made and extended transversely. The fascia was separated from the underlying rectus tissue superiorly and inferiorly. The peritoneum was identified and entered. Peritoneal incision was extended longitudinally. The utero-vesical peritoneal reflection was incised transversely and the bladder flap was bluntly freed from the lower uterine segment. A low transverse uterine incision was made. Delivered from vertex presentation was a baby with Apgar scores of 8 at one minute and 9 at five minutes. After the umbilical cord was clamped and cut cord blood was obtained for evaluation. The placenta was removed intact and appeared normal. The uterine outline, tubes and ovaries appeared normal. The uterine incision was closed with running locked sutures of 0 monocryl and imbricated with 0 monocryl. Hemostasis was observed. Lavage was carried out until clear. The peritoneum was then closed with 0 monocryl and rectus muscles plicated in the midline.  After hemostasis was assured, the fascia was then reapproximated with running sutures of 0 PDS. Irrigation was applied and after adequate hemostasis was assured, the skin was  reapproximated with subcutaneous sutures using 4-0 monocryl.  Instrument, sponge, and needle counts were correct prior the abdominal closure and at the conclusion of the case. The patient received 2 grams cefotetan preoperatively.  Findings: Viable female  Estimated Blood Loss:  1100cc         Specimens: Placenta was sent to labor and delivery         Complications:  None

## 2018-06-20 NOTE — Transfer of Care (Signed)
Immediate Anesthesia Transfer of Care Note  Patient: Leslie Fisher  Procedure(s) Performed: CESAREAN SECTION (N/A Abdomen)  Patient Location: PACU  Anesthesia Type:Epidural  Level of Consciousness: awake, alert , oriented and patient cooperative  Airway & Oxygen Therapy: Patient Spontanous Breathing  Post-op Assessment: Report given to RN and Post -op Vital signs reviewed and stable  Post vital signs: Reviewed and stable  Last Vitals:  Vitals Value Taken Time  BP    Temp    Pulse    Resp    SpO2      Last Pain:  Vitals:   06/19/18 2202  TempSrc: Oral  PainSc:          Complications: No apparent anesthesia complications

## 2018-06-20 NOTE — Addendum Note (Signed)
Addendum  created 06/20/18 0753 by Junious SilkGilbert, Keimani Laufer, CRNA   Sign clinical note

## 2018-06-20 NOTE — Lactation Note (Signed)
This note was copied from a baby's chart. Lactation Consultation Note  Patient Name: Leslie Mike GipRachel Doe ZOXWR'UToday's Date: 06/20/2018   P1 Baby 12 hours old.  Hand expression taught to Mom with good flow of colostrum. Had mother prepump and R side and then assisted w/ latching in cross cradle hold. Swallows observed and heard. Mom encouraged to feed baby 8-12 times/24 hours and with feeding cues.  Basics reviewed. Mother was set up w/ DEBP by RN.  Told mother to call if she would like assistance w/ pumping later today. Mom made aware of O/P services, breastfeeding support groups, community resources, and our phone # for post-discharge questions.        Maternal Data    Feeding Feeding Type: Breast Fed Length of feed: 20 min  LATCH Score Latch: Grasps breast easily, tongue down, lips flanged, rhythmical sucking.  Audible Swallowing: A few with stimulation  Type of Nipple: Everted at rest and after stimulation  Comfort (Breast/Nipple): Soft / non-tender  Hold (Positioning): Assistance needed to correctly position infant at breast and maintain latch.  LATCH Score: 8  Interventions Interventions: Breast feeding basics reviewed;Assisted with latch;Skin to skin;Adjust position;Support pillows  Lactation Tools Discussed/Used     Consult Status      Leslie PulleyBerkelhammer, Leslie Fisher 06/20/2018, 12:06 PM

## 2018-06-20 NOTE — Anesthesia Postprocedure Evaluation (Signed)
Anesthesia Post Note  Patient: Kym GroomRachel M Garfield  Procedure(s) Performed: CESAREAN SECTION (N/A Abdomen)     Patient location during evaluation: PACU Anesthesia Type: Epidural Level of consciousness: awake and alert Pain management: pain level controlled Vital Signs Assessment: post-procedure vital signs reviewed and stable Respiratory status: spontaneous breathing Cardiovascular status: stable Postop Assessment: epidural receding Anesthetic complications: no    Last Vitals:  Vitals:   06/20/18 0430 06/20/18 0537  BP: 110/62 109/74  Pulse: 93 89  Resp: 18 19  Temp: 36.9 C 36.8 C  SpO2:  99%    Last Pain:  Vitals:   06/20/18 0537  TempSrc:   PainSc: 0-No pain   Pain Goal:                 Lewie LoronJohn Adam Sanjuan

## 2018-06-20 NOTE — Anesthesia Postprocedure Evaluation (Signed)
Anesthesia Post Note  Patient: Kym GroomRachel M Kleinert  Procedure(s) Performed: CESAREAN SECTION (N/A Abdomen)     Patient location during evaluation: Mother Baby Anesthesia Type: Epidural Level of consciousness: awake and alert Pain management: pain level controlled Vital Signs Assessment: post-procedure vital signs reviewed and stable Respiratory status: spontaneous breathing, nonlabored ventilation and respiratory function stable Cardiovascular status: stable Postop Assessment: no headache, no backache and epidural receding Anesthetic complications: no    Last Vitals:  Vitals:   06/20/18 0430 06/20/18 0537  BP: 110/62 109/74  Pulse: 93 89  Resp: 18 19  Temp: 36.9 C 36.8 C  SpO2:  99%    Last Pain:  Vitals:   06/20/18 0537  TempSrc:   PainSc: 0-No pain   Pain Goal:                 Junious SilkGILBERT,Eryx Zane

## 2018-06-21 NOTE — Progress Notes (Signed)
Subjective: Postpartum Day 2: Cesarean Delivery Patient reports tolerating PO, + flatus and no problems voiding.    Objective: Vital signs in last 24 hours: Temp:  [98 F (36.7 C)-98.9 F (37.2 C)] 98 F (36.7 C) (06/28 0654) Pulse Rate:  [67-98] 86 (06/28 0654) Resp:  [16-18] 16 (06/28 0654) BP: (91-129)/(49-76) 129/76 (06/28 0654) SpO2:  [97 %-99 %] 98 % (06/28 0654)  Physical Exam:  General: alert, cooperative and no distress Lochia: appropriate Uterine Fundus: firm Incision: healing well DVT Evaluation: No evidence of DVT seen on physical exam.  Recent Labs    06/19/18 0700 06/20/18 0539  HGB 12.1 9.9*  HCT 36.3 29.6*    Assessment/Plan: Status post Cesarean section. Doing well postoperatively.  Continue current care.  Roselle LocusJames E Mychele Seyller II 06/21/2018, 8:17 AM

## 2018-06-22 MED ORDER — ACETAMINOPHEN 325 MG PO TABS: 650.0000 mg | ORAL_TABLET | Freq: Four times a day (QID) | ORAL | 0 refills | Status: AC | PRN

## 2018-06-22 MED ORDER — IBUPROFEN 600 MG PO TABS: 600.0000 mg | ORAL_TABLET | Freq: Four times a day (QID) | ORAL | 0 refills | Status: AC | PRN

## 2018-06-22 NOTE — Lactation Note (Signed)
This note was copied from a baby's chart. Lactation Consultation Note  Patient Name: Leslie Fisher ZOXWR'UToday's Date: 06/22/2018 Reason for consult: Follow-up assessment   Baby 59 hours old and baby cueing. Observed feeding with rhythmical sucks and swallows heard and observed. Encouraged mother to support breast and compress. Mom encouraged to feed baby 8-12 times/24 hours and with feeding cues.  Reviewed engorgement care and monitoring voids/stools.    Maternal Data Has patient been taught Hand Expression?: Yes  Feeding Feeding Type: Breast Fed Length of feed: 9 min  LATCH Score Latch: Grasps breast easily, tongue down, lips flanged, rhythmical sucking.  Audible Swallowing: Spontaneous and intermittent  Type of Nipple: Everted at rest and after stimulation  Comfort (Breast/Nipple): Soft / non-tender  Hold (Positioning): No assistance needed to correctly position infant at breast.  LATCH Score: 10  Interventions    Lactation Tools Discussed/Used     Consult Status Consult Status: Complete Date: 06/22/18 Follow-up type: In-patient    [name], [name] [facility] 06/22/2018, 10:59 AM

## 2018-06-22 NOTE — Discharge Summary (Signed)
Obstetric Discharge Summary Reason for Admission: onset of labor Prenatal Procedures: none Intrapartum Procedures: cesarean: low cervical, transverse Postpartum Procedures: none Complications-Operative and Postpartum: none Hemoglobin  Date Value Ref Range Status  06/20/2018 9.9 (L) 12.0 - 15.0 g/dL Final   HCT  Date Value Ref Range Status  06/20/2018 29.6 (L) 36.0 - 46.0 % Final    Physical Exam:  General: alert, cooperative and no distress Lochia: appropriate Uterine Fundus: firm Incision: healing well DVT Evaluation: No evidence of DVT seen on physical exam.  Discharge Diagnoses: Term Pregnancy-delivered  Discharge Information: Date: 06/22/2018 Activity: pelvic rest Diet: routine Medications: PNV and Ibuprofen Condition: stable Instructions: refer to practice specific booklet Discharge to: home   Newborn Data: Live born female  Birth Weight: 9 lb 7 oz (4281 g) APGAR: 8, 9  Newborn Delivery   Birth date/time:  06/19/2018 23:24:00 Delivery type:  C-Section, Low Transverse Trial of labor:  Yes C-section categorization:  Primary     Home with mother.  Leslie Fisher 06/22/2018, 9:37 AM

## 2018-06-22 NOTE — Lactation Note (Signed)
This note was copied from a baby's chart. Lactation Consultation Note  Patient Name: Boy Mike GipRachel Rohrig WUJWJ'XToday's Date: 06/22/2018 Reason for consult: Follow-up assessment   P1, Baby 57 hours old.  7.5% weight loss.  Voids/stools WNL.  Mother has good flow of colsotrum.  Mom has my # to call for assist w/next feeding. Baby sleeping in crib after brief breastfeeding session. Mom encouraged to feed baby 8-12 times/24 hours and with feeding cues.  Reviewed engorgement care and monitoring voids/stools.    Maternal Data Has patient been taught Hand Expression?: Yes  Feeding Feeding Type: Breast Fed Length of feed: 9 min  LATCH Score Latch: Grasps breast easily, tongue down, lips flanged, rhythmical sucking.  Audible Swallowing: Spontaneous and intermittent  Type of Nipple: Everted at rest and after stimulation  Comfort (Breast/Nipple): Filling, red/small blisters or bruises, mild/mod discomfort  Hold (Positioning): Assistance needed to correctly position infant at breast and maintain latch.  LATCH Score: 8  Interventions    Lactation Tools Discussed/Used     Consult Status Consult Status: Follow-up Date: 06/22/18 Follow-up type: In-patient    [name], [name] [facility] 06/22/2018, 9:07 AM

## 2018-06-24 ENCOUNTER — Inpatient Hospital Stay (HOSPITAL_COMMUNITY): Admission: RE | Admit: 2018-06-24 | Payer: Managed Care, Other (non HMO) | Source: Ambulatory Visit
